# Patient Record
Sex: Male | Born: 1985 | Race: Black or African American | Hispanic: No | Marital: Single | Smoking: Former smoker
Health system: Southern US, Community
[De-identification: ages and names within clinical notes are randomized; demographics above are authoritative.]

## PROBLEM LIST (undated history)

## (undated) DIAGNOSIS — I10 Essential (primary) hypertension: Secondary | ICD-10-CM

## (undated) DIAGNOSIS — N289 Disorder of kidney and ureter, unspecified: Secondary | ICD-10-CM

---

## 2019-01-03 ENCOUNTER — Other Ambulatory Visit: Payer: Self-pay

## 2019-01-03 ENCOUNTER — Encounter (HOSPITAL_COMMUNITY): Payer: Self-pay

## 2019-01-03 ENCOUNTER — Emergency Department (HOSPITAL_COMMUNITY)
Admission: EM | Admit: 2019-01-03 | Discharge: 2019-01-04 | Disposition: A | Discharge status: Home / Self Care | Payer: No Typology Code available for payment source | Attending: Emergency Medicine | Admitting: Emergency Medicine

## 2019-01-03 ENCOUNTER — Emergency Department (HOSPITAL_COMMUNITY): Payer: No Typology Code available for payment source

## 2019-01-03 DIAGNOSIS — R0789 Other chest pain: Secondary | ICD-10-CM

## 2019-01-03 DIAGNOSIS — I10 Essential (primary) hypertension: Secondary | ICD-10-CM | POA: Insufficient documentation

## 2019-01-03 DIAGNOSIS — R109 Unspecified abdominal pain: Secondary | ICD-10-CM

## 2019-01-03 HISTORY — DX: Disorder of kidney and ureter, unspecified: N28.9

## 2019-01-03 HISTORY — DX: Essential (primary) hypertension: I10

## 2019-01-03 LAB — COMPREHENSIVE METABOLIC PANEL
ALT: 13 U/L (ref 0–44)
AST: 11 U/L — ABNORMAL LOW (ref 15–41)
Albumin: 4.1 g/dL (ref 3.5–5.0)
Alkaline Phosphatase: 65 U/L (ref 38–126)
Anion gap: 12 (ref 5–15)
BUN: 55 mg/dL — ABNORMAL HIGH (ref 6–20)
CO2: 25 mmol/L (ref 22–32)
Calcium: 9.3 mg/dL (ref 8.9–10.3)
Chloride: 101 mmol/L (ref 98–111)
Creatinine, Ser: 7.96 mg/dL — ABNORMAL HIGH (ref 0.61–1.24)
GFR calc Af Amer: 9 mL/min — ABNORMAL LOW (ref >60)
GFR calc non Af Amer: 8 mL/min — ABNORMAL LOW (ref >60)
Glucose, Bld: 106 mg/dL — ABNORMAL HIGH (ref 70–99)
Potassium: 3.9 mmol/L (ref 3.5–5.1)
Sodium: 138 mmol/L (ref 135–145)
Total Bilirubin: 0.8 mg/dL (ref 0.3–1.2)
Total Protein: 7.8 g/dL (ref 6.5–8.1)

## 2019-01-03 LAB — LIPASE, BLOOD: Lipase: 54 U/L — ABNORMAL HIGH (ref 11–51)

## 2019-01-03 LAB — CBC WITH DIFFERENTIAL/PLATELET
Abs Immature Granulocytes: 0.02 10*3/uL (ref 0.00–0.07)
Basophils Absolute: 0.1 10*3/uL (ref 0.0–0.1)
Basophils Relative: 1 %
Eosinophils Absolute: 0.5 10*3/uL (ref 0.0–0.5)
Eosinophils Relative: 6 %
HCT: 38.7 % — ABNORMAL LOW (ref 39.0–52.0)
Hemoglobin: 12.5 g/dL — ABNORMAL LOW (ref 13.0–17.0)
Immature Granulocytes: 0 %
Lymphocytes Relative: 21 %
Lymphs Abs: 1.9 10*3/uL (ref 0.7–4.0)
MCH: 30.9 pg (ref 26.0–34.0)
MCHC: 32.3 g/dL (ref 30.0–36.0)
MCV: 95.8 fL (ref 80.0–100.0)
Monocytes Absolute: 0.7 10*3/uL (ref 0.1–1.0)
Monocytes Relative: 8 %
Neutro Abs: 5.8 10*3/uL (ref 1.7–7.7)
Neutrophils Relative %: 64 %
Platelets: 278 10*3/uL (ref 150–400)
RBC: 4.04 MIL/uL — ABNORMAL LOW (ref 4.22–5.81)
RDW: 12 % (ref 11.5–15.5)
WBC: 9 10*3/uL (ref 4.0–10.5)
nRBC: 0 % (ref 0.0–0.2)

## 2019-01-03 MED ORDER — ACETAMINOPHEN 325 MG PO TABS
650.0000 mg | ORAL_TABLET | Freq: Once | ORAL | Status: AC
  Administered 2019-01-03: 650 mg via ORAL
  Filled 2019-01-03: qty 2

## 2019-01-03 MED ORDER — OXYCODONE-ACETAMINOPHEN 5-325 MG PO TABS
2.0000 | ORAL_TABLET | Freq: Once | ORAL | Status: DC
  Filled 2019-01-03: qty 2

## 2019-01-03 NOTE — ED Notes (Signed)
Quick look done by Dr Tyrone Nine in triage
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
## 2019-01-04 MED ORDER — OXYCODONE-ACETAMINOPHEN 5-325 MG PO TABS: 1.0000 | ORAL_TABLET | ORAL | 0 refills | Status: AC | PRN

## 2019-01-04 MED ORDER — CYCLOBENZAPRINE HCL 10 MG PO TABS: 10.0000 mg | ORAL_TABLET | Freq: Two times a day (BID) | ORAL | 0 refills | Status: DC | PRN

## 2019-01-04 MED ORDER — MORPHINE SULFATE (PF) 4 MG/ML IV SOLN
6.0000 mg | Freq: Once | INTRAVENOUS | Status: AC
  Administered 2019-01-04: 6 mg via INTRAMUSCULAR
  Filled 2019-01-04: qty 2
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
## 2019-01-05 ENCOUNTER — Telehealth: Payer: Self-pay | Admitting: *Deleted

## 2019-01-05 NOTE — Telephone Encounter (Signed)
TOC CM received call from pt and states Oxycodone was $60 at his pharmacy. He wanted transferred to [facility]. Provided pt with goodrx coupon for [facility] for $8.81. States he will pick up at his pharmacy. Flexeril with goodrx will cost $10.38. [place], [place] ED TOC CM [phone]

## 2021-02-16 ENCOUNTER — Emergency Department (HOSPITAL_COMMUNITY): Payer: No Typology Code available for payment source

## 2021-02-16 ENCOUNTER — Observation Stay (HOSPITAL_COMMUNITY)
Admission: EM | Admit: 2021-02-16 | Discharge: 2021-02-17 | Disposition: A | Discharge status: Home / Self Care | Payer: No Typology Code available for payment source | Attending: Internal Medicine | Admitting: Internal Medicine

## 2021-02-16 ENCOUNTER — Encounter (HOSPITAL_COMMUNITY): Payer: Self-pay

## 2021-02-16 ENCOUNTER — Other Ambulatory Visit: Payer: Self-pay

## 2021-02-16 DIAGNOSIS — Z9889 Other specified postprocedural states: Secondary | ICD-10-CM | POA: Insufficient documentation

## 2021-02-16 DIAGNOSIS — K659 Peritonitis, unspecified: Secondary | ICD-10-CM

## 2021-02-16 DIAGNOSIS — Z20822 Contact with and (suspected) exposure to covid-19: Secondary | ICD-10-CM | POA: Diagnosis not present

## 2021-02-16 DIAGNOSIS — Z992 Dependence on renal dialysis: Secondary | ICD-10-CM | POA: Diagnosis not present

## 2021-02-16 DIAGNOSIS — N186 End stage renal disease: Principal | ICD-10-CM | POA: Insufficient documentation

## 2021-02-16 DIAGNOSIS — R0602 Shortness of breath: Secondary | ICD-10-CM | POA: Diagnosis present

## 2021-02-16 DIAGNOSIS — Z87891 Personal history of nicotine dependence: Secondary | ICD-10-CM | POA: Diagnosis not present

## 2021-02-16 DIAGNOSIS — I120 Hypertensive chronic kidney disease with stage 5 chronic kidney disease or end stage renal disease: Secondary | ICD-10-CM | POA: Diagnosis not present

## 2021-02-16 DIAGNOSIS — E8770 Fluid overload, unspecified: Secondary | ICD-10-CM

## 2021-02-16 DIAGNOSIS — Z79899 Other long term (current) drug therapy: Secondary | ICD-10-CM | POA: Insufficient documentation

## 2021-02-16 LAB — BRAIN NATRIURETIC PEPTIDE: B Natriuretic Peptide: 2005.7 pg/mL — ABNORMAL HIGH (ref 0.0–100.0)

## 2021-02-16 LAB — COMPREHENSIVE METABOLIC PANEL
ALT: 24 U/L (ref 0–44)
AST: 21 U/L (ref 15–41)
Albumin: 3.4 g/dL — ABNORMAL LOW (ref 3.5–5.0)
Alkaline Phosphatase: 84 U/L (ref 38–126)
Anion gap: 17 — ABNORMAL HIGH (ref 5–15)
BUN: 84 mg/dL — ABNORMAL HIGH (ref 6–20)
CO2: 19 mmol/L — ABNORMAL LOW (ref 22–32)
Calcium: 8.5 mg/dL — ABNORMAL LOW (ref 8.9–10.3)
Chloride: 100 mmol/L (ref 98–111)
Creatinine, Ser: 19.32 mg/dL — ABNORMAL HIGH (ref 0.61–1.24)
GFR, Estimated: 3 mL/min — ABNORMAL LOW (ref >60)
Glucose, Bld: 111 mg/dL — ABNORMAL HIGH (ref 70–99)
Potassium: 5.6 mmol/L — ABNORMAL HIGH (ref 3.5–5.1)
Sodium: 136 mmol/L (ref 135–145)
Total Bilirubin: 0.8 mg/dL (ref 0.3–1.2)
Total Protein: 7 g/dL (ref 6.5–8.1)

## 2021-02-16 LAB — TROPONIN I (HIGH SENSITIVITY)
Troponin I (High Sensitivity): 58 ng/L — ABNORMAL HIGH (ref <18)
Troponin I (High Sensitivity): 53 ng/L — ABNORMAL HIGH (ref <18)

## 2021-02-16 LAB — CBC WITH DIFFERENTIAL/PLATELET
Abs Immature Granulocytes: 0.06 10*3/uL (ref 0.00–0.07)
Basophils Absolute: 0.1 10*3/uL (ref 0.0–0.1)
Basophils Relative: 1 %
Eosinophils Absolute: 0.4 10*3/uL (ref 0.0–0.5)
Eosinophils Relative: 4 %
HCT: 25.5 % — ABNORMAL LOW (ref 39.0–52.0)
Hemoglobin: 8.1 g/dL — ABNORMAL LOW (ref 13.0–17.0)
Immature Granulocytes: 1 %
Lymphocytes Relative: 14 %
Lymphs Abs: 1.5 10*3/uL (ref 0.7–4.0)
MCH: 31 pg (ref 26.0–34.0)
MCHC: 31.8 g/dL (ref 30.0–36.0)
MCV: 97.7 fL (ref 80.0–100.0)
Monocytes Absolute: 0.8 10*3/uL (ref 0.1–1.0)
Monocytes Relative: 7 %
Neutro Abs: 7.9 10*3/uL — ABNORMAL HIGH (ref 1.7–7.7)
Neutrophils Relative %: 73 %
Platelets: 318 10*3/uL (ref 150–400)
RBC: 2.61 MIL/uL — ABNORMAL LOW (ref 4.22–5.81)
RDW: 13.2 % (ref 11.5–15.5)
WBC: 10.8 10*3/uL — ABNORMAL HIGH (ref 4.0–10.5)
nRBC: 0 % (ref 0.0–0.2)

## 2021-02-16 LAB — HEPATITIS B SURFACE ANTIGEN: Hepatitis B Surface Ag: NONREACTIVE

## 2021-02-16 LAB — LIPASE, BLOOD: Lipase: 41 U/L (ref 11–51)

## 2021-02-16 LAB — RESP PANEL BY RT-PCR (FLU A&B, COVID) ARPGX2
Influenza A by PCR: NEGATIVE
Influenza B by PCR: NEGATIVE
SARS Coronavirus 2 by RT PCR: NEGATIVE

## 2021-02-16 LAB — HEPATITIS B SURFACE ANTIBODY,QUALITATIVE: Hep B S Ab: REACTIVE — AB

## 2021-02-16 LAB — VANCOMYCIN, RANDOM: Vancomycin Rm: 13

## 2021-02-16 LAB — HIV ANTIBODY (ROUTINE TESTING W REFLEX): HIV Screen 4th Generation wRfx: NONREACTIVE

## 2021-02-16 MED ORDER — HEPARIN SODIUM (PORCINE) 5000 UNIT/ML IJ SOLN
5000.0000 [IU] | Freq: Three times a day (TID) | INTRAMUSCULAR | Status: DC
  Administered 2021-02-16 – 2021-02-17 (×2): 5000 [IU] via SUBCUTANEOUS
  Filled 2021-02-16 (×2): qty 1

## 2021-02-16 MED ORDER — CHLORHEXIDINE GLUCONATE CLOTH 2 % EX PADS
6.0000 | MEDICATED_PAD | Freq: Every day | CUTANEOUS | Status: DC
  Administered 2021-02-17: 08:00:00 6 via TOPICAL

## 2021-02-16 MED ORDER — ACETAMINOPHEN 325 MG PO TABS
650.0000 mg | ORAL_TABLET | Freq: Four times a day (QID) | ORAL | Status: DC | PRN
  Administered 2021-02-16 – 2021-02-17 (×2): 650 mg via ORAL
  Filled 2021-02-16 (×2): qty 2

## 2021-02-16 MED ORDER — HEPARIN SODIUM (PORCINE) 1000 UNIT/ML DIALYSIS
2000.0000 [IU] | Freq: Once | INTRAMUSCULAR | Status: AC
  Administered 2021-02-16: 16:00:00 2000 [IU] via INTRAVENOUS_CENTRAL
  Filled 2021-02-16: qty 2

## 2021-02-16 MED ORDER — HYDROMORPHONE HCL 1 MG/ML IJ SOLN
1.0000 mg | Freq: Once | INTRAMUSCULAR | Status: AC
  Administered 2021-02-16: 12:00:00 1 mg via INTRAVENOUS
  Filled 2021-02-16: qty 1

## 2021-02-16 MED ORDER — ONDANSETRON HCL 4 MG PO TABS: 4.0000 mg | ORAL_TABLET | Freq: Four times a day (QID) | ORAL | Status: DC | PRN

## 2021-02-16 MED ORDER — VANCOMYCIN HCL 1750 MG/350ML IV SOLN
1750.0000 mg | Freq: Once | INTRAVENOUS | Status: DC
  Filled 2021-02-16: qty 350

## 2021-02-16 MED ORDER — ONDANSETRON HCL 4 MG/2ML IJ SOLN
4.0000 mg | Freq: Four times a day (QID) | INTRAMUSCULAR | Status: DC | PRN
  Administered 2021-02-16 (×2): 4 mg via INTRAVENOUS
  Filled 2021-02-16 (×2): qty 2

## 2021-02-16 MED ORDER — OXYCODONE-ACETAMINOPHEN 5-325 MG PO TABS: 1.0000 | ORAL_TABLET | ORAL | Status: DC | PRN

## 2021-02-16 MED ORDER — VANCOMYCIN HCL 750 MG/150ML IV SOLN
750.0000 mg | INTRAVENOUS | Status: DC
Start: 2021-02-18 — End: 2021-02-17

## 2021-02-16 MED ORDER — HYDRALAZINE HCL 50 MG PO TABS
50.0000 mg | ORAL_TABLET | Freq: Three times a day (TID) | ORAL | Status: DC
  Administered 2021-02-16 – 2021-02-17 (×3): 50 mg via ORAL
  Filled 2021-02-16: qty 1
  Filled 2021-02-16: qty 2
  Filled 2021-02-16: qty 1

## 2021-02-16 MED ORDER — DARBEPOETIN ALFA 60 MCG/0.3ML IJ SOSY
60.0000 ug | PREFILLED_SYRINGE | INTRAMUSCULAR | Status: DC
  Administered 2021-02-16: 16:00:00 60 ug via INTRAVENOUS
  Filled 2021-02-16: qty 0.3

## 2021-02-16 MED ORDER — FAMOTIDINE 20 MG PO TABS
40.0000 mg | ORAL_TABLET | Freq: Every day | ORAL | Status: DC
  Administered 2021-02-17: 08:00:00 40 mg via ORAL
  Filled 2021-02-16: qty 2

## 2021-02-16 MED ORDER — FENTANYL CITRATE PF 50 MCG/ML IJ SOSY
50.0000 ug | PREFILLED_SYRINGE | Freq: Once | INTRAMUSCULAR | Status: AC
  Administered 2021-02-16: 11:00:00 50 ug via INTRAVENOUS
  Filled 2021-02-16: qty 1

## 2021-02-16 MED ORDER — VANCOMYCIN HCL IN DEXTROSE 1-5 GM/200ML-% IV SOLN
1000.0000 mg | INTRAVENOUS | Status: AC
  Administered 2021-02-16: 1000 mg via INTRAVENOUS
  Filled 2021-02-16: qty 200

## 2021-02-16 MED ORDER — SEVELAMER CARBONATE 800 MG PO TABS
800.0000 mg | ORAL_TABLET | Freq: Three times a day (TID) | ORAL | Status: DC
  Administered 2021-02-17: 08:00:00 800 mg via ORAL
  Filled 2021-02-16: qty 1

## 2021-02-16 MED ORDER — POLYETHYLENE GLYCOL 3350 17 G PO PACK: 17.0000 g | PACK | Freq: Every day | ORAL | Status: DC | PRN

## 2021-02-16 MED ORDER — HYDROMORPHONE HCL 1 MG/ML IJ SOLN
0.5000 mg | INTRAMUSCULAR | Status: DC | PRN
Start: 2021-02-16 — End: 2021-02-17
  Administered 2021-02-16: 20:00:00 1 mg via INTRAVENOUS
  Filled 2021-02-16: qty 1

## 2021-02-16 MED ORDER — ACETAMINOPHEN 650 MG RE SUPP: 650.0000 mg | Freq: Four times a day (QID) | RECTAL | Status: DC | PRN

## 2021-02-16 NOTE — Progress Notes (Addendum)
Pharmacy Antibiotic Note  Daniel Stephenson is a 35 y.o. male admitted on 02/16/2021 with  PD cath related peritonitis .  Pharmacy has been consulted for vancomycin dosing.  Presenting with SOB and no HD for 6 days (was on PD until hernia repair 1 month ago and was changed to HD). Visiting from [place]. Has known infection from pubic catheter - had been getting antibiotics while in dialysis but was unable to get dialysis set up here in [place] while visiting. Plan for HD while here. WBC 10.8, Scr 19.32. Afebrile.   Vancomycin random prior to HD start came back at 13 - slightly below goal range (15-25).   Plan: Vancomycin 1000 mg IV once today then 750 mg IV qHD starting Wednesday Monitor cx results, clinical pic, and vanc levels as appropriate  Height: 5\' 4"  (162.6 cm) Weight: 78.9 kg (174 lb) IBW/kg (Calculated) : 59.2  Temp (24hrs), Avg:98.2 F (36.8 C), Min:98.2 F (36.8 C), Max:98.2 F (36.8 C)  Recent Labs  Lab 02/16/21 0820  WBC 10.8*  CREATININE 19.32*    Estimated Creatinine Clearance: 5.1 mL/min (A) (by C-G formula based on SCr of 19.32 mg/dL (H)).    Not on File  Antimicrobials this admission: Vancomycin 12/26 >>   Dose adjustments this admission: 12/26 VR at start of HD 13: gave 1g IV with HD   Microbiology results: 12/26 Peritoneal Fluid Cx: sent 12/26 COVID/Flu PCR: neg  Thank you for allowing pharmacy to be a part of this patients care.  Antonietta Jewel, PharmD, [place] Clinical Pharmacist  Phone: [phone] 02/16/2021 11:43 AM  Please check AMION for all [place] phone numbers After 10:00 PM, call [place] [phone]

## 2021-02-16 NOTE — ED Notes (Signed)
Pt has refused labs and would like for them to be pulled from his IV.

## 2021-02-16 NOTE — H&P (Addendum)
Date: 02/16/2021               Patient Name:  Daniel Stephenson MRN: [id number]  DOB: 1985-09-12 Age / Sex: 35 y.o., male   PCP: Patient, No Pcp Per (Inactive)         Medical Service: Internal Medicine Teaching Service         Attending Physician: Dr. Jimmye Norman, Elaina Pattee, MD    First Contact: Dr. Alvie Heidelberg Pager: [phone]  Second Contact: Dr. Allyson Sabal Pager: [phone]       After Hours (After 5p/  First Contact Pager: [phone]  weekends / holidays): Second Contact Pager: [phone]   Chief Complaint: shortness of breath  History of Present Illness:  Daniel Stephenson is a 35 year old male with a PMHx of HTN and ESRD on MWF HD presenting with missed dialysis and shortness of breath.   The patient reportedly developed ESRD when he was 35 years old. He reports this was due to hypertension. He has done peritoneal dialysis for years, but 1 month ago he developed an umbilical hernia and was switched to HD to have the repair performed. The repair was performed successfully. Unfortunately several days later he developed severe abdominal pain. His PD catheter had leaked and become unscrewed. He reportedly went to [facility], was diagnosed with peritonitis, and received vancomycin with relief of his pain and improvement in his clinical status. He was discharged and was set to receive vanc at his next dialysis session, but left to visit [place] before this occurred.   The patient lives in [place], [place] and came to [place] with his wife to visit his parents. He was unable to get dialysis scheduled with [facility] but decided to come anyway.   He began having shortness of breath beginning last night. He reports that he has only missed dialysis one time and is therefore unfamiliar about symptoms when this occurs. He is at his dry weight of 174 lbs. He has also been having some abdominal pain, which he rates as a 6/10, nothing like the pain he had with his peritonitis.  ED Course: BP from 150-180, notable labs  include BNP of 2K, CXR with pulmonary edema and a possible RLL opacity, WBCs at 10.8.   Meds:  Labetalol 400mg  bid Torsemide 20mg  daily  Doxazosin 4mg  BID  Nifedipine 60mg  BID Sevelamer 800mg  TID  Hydralazine 50mg  TID  Famotidine 40mg  BID  Oxycodone-acetaminophen 5-325mg  prn, reportedly takes 1 per day   Allergies: Allergies as of 02/16/2021   (Not on File)   Past Medical History:  Diagnosis Date   Hypertension    Peritoneal dialysis status ([place])    Renal disorder     Family History:  No family history of kidney disease. Strong family history of HTN on both sides. No history of cancers or heart attacks.  Social History:  Lives with wife at home. Independent in ADLs and iADLs. Does not consume alcohol, does not smoke cigarettes, no illicit drug use.  PCP is Verdie Mosher, MD at [facility] in [place]. Gets HD at [facility] in [place]. Former Nephrologist is Dr. Glennon Mac, but currently switching nephrologists, does not have a new one yet.  Review of Systems: A complete ROS was negative except as per HPI.    Physical Exam: Blood pressure (!) 176/114, pulse 94, temperature 98 F (36.7 C), temperature source Oral, resp. rate (!) 25, height 5\' 4"  (1.626 m), weight 78.9 kg, SpO2 96 %. Physical Exam Vitals reviewed.  Constitutional:  Comments: Lethargic, intermittently alert for questioning  Cardiovascular:     Rate and Rhythm: Normal rate and regular rhythm.     Heart sounds: No murmur heard. Pulmonary:     Comments: Tachypnea with belly breathing,  Clear to auscultation Abdominal:     Palpations: Abdomen is soft.     Tenderness: There is abdominal tenderness.     Comments: Surgical scar above the umbilicus Moderately tender to palpation No guarding, does not appear peritonitic PD cath exit site is covered with guaze, no drainage noted.  Musculoskeletal:     Comments: Trace lower extremity pitting edema  Skin:    General: Skin is warm  and dry.     Comments: Tunneled line emerges near the R clavicle   EKG: personally reviewed my interpretation is normal sinus rhythm  CXR: personally reviewed my interpretation is potential interstitial edema, possible RLL opacity  Assessment & Plan by Problem: Daniel Stephenson is a 35 year old male with a PMHx of HTN and ESRD on MWF HD presenting with missed dialysis and shortness of breath.   #ESRD on MWF HD #HTN Vitals, labs, imaging, and presentation consistent with volume overload 2/2 missed dialysis. Nephro is planning for dialysis today. His home anti-hypertensive regimen is extensive, so we will hold his medications except for the below.  -nephrology consult, appreciate recs -start Hydralazine 50 mg TID -Monitor BP  #Hx PD cath related peritonitis #Acute pain Low suspicion for recurrence of peritonitis. Patient's abdominal pain is similar to the pain he has had from his umbilical hernia repair.  -Continue IV vanc -Records request for [facility] -Consider sampling ascitic fluid if condition worsens -Start Dilaudid 0.5-1 mg q3hrs PRN  -Restart home Percocet 5-325 q4hrs PRN starting tomorrow   Dispo: Admit patient to Inpatient with expected length of stay greater than 2 midnights.  Signed: Corky Sox, MD PGY-1 Pager: [phone] After 5pm on weekdays and 1pm on weekends: On Call pager: [phone]

## 2021-02-16 NOTE — Procedures (Signed)
° °  I was present at this dialysis session, have reviewed the session itself and made  appropriate changes Kelly Splinter MD [place] pager [phone]   02/16/2021, 5:36 PM
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
## 2021-02-16 NOTE — ED Provider Notes (Signed)
[facility] EMERGENCY DEPARTMENT Provider Note   CSN: [id number] Arrival date & time: 02/16/21  0725     History Chief Complaint  Patient presents with   Shortness of Breath   Abdominal Pain    Daniel Stephenson is a 35 y.o. male.  Presenting to the emergency room with concern for shortness of breath.  Reports that has had a little bit of shortness of breath the last few days but seems to be getting a lot worse since last night.  Worse with exertion, improved with rest, worse with lying flat.  Reports that he is visiting from out of town and last received dialysis 6 days ago.  Reports he normally does peritoneal dialysis however he had a hernia repair approximately 1 month ago and was told that he will need to use hemodialysis for couple months postoperatively while his abdomen heals.  Reports that a little over a week ago he was admitted to the hospital in [place] for peritonitis.  Reports that he was supposed to receive intraperitoneal antibiotics after discharge while in the dialysis clinic however he came to [place] to visit family and had contacted a [facility] dialysis clinic to set up dialysis but was unsuccessful in getting dialysis set up here in [place].  So in addition to missing dialysis he has also missed his intraperitoneal antibiotics for recent diagnosis of peritonitis.  He denies fever but has had some ongoing abdominal pain and discomfort.  No nausea or vomiting.  Pain is currently mild.  He is getting hemodialysis through right chest tunneled catheter, no issues recently.  HPI     Past Medical History:  Diagnosis Date   Hypertension    Peritoneal dialysis status [facility])    Renal disorder     Patient Active Problem List   Diagnosis Date Noted   Admission for acute hemodialysis ([place]) 02/16/2021    History reviewed. No pertinent surgical history.     History reviewed. No pertinent family history.  Social History   Tobacco Use    Smoking status: Former    Types: Cigarettes   Smokeless tobacco: Never  Substance Use Topics   Alcohol use: Not Currently   Drug use: Not Currently    Home Medications Prior to Admission medications   Medication Sig Start Date End Date Taking? Authorizing Provider  cyclobenzaprine (FLEXERIL) 5 MG tablet Take 5 mg by mouth 3 (three) times daily as needed for muscle spasms. 02/10/21  Yes [provider]  doxazosin (CARDURA) 2 MG tablet Take 2 mg by mouth 2 (two) times daily. 02/11/21  Yes [provider]  famotidine (PEPCID) 40 MG tablet Take 40 mg by mouth 2 (two) times daily. 02/14/21  Yes [provider]  hydrALAZINE (APRESOLINE) 50 MG tablet Take 50 mg by mouth 3 (three) times daily. 02/14/21  Yes [provider]  HYDROcodone-acetaminophen (NORCO/VICODIN) 5-325 MG tablet Take 1 tablet by mouth every 6 (six) hours as needed for pain. 02/10/21  Yes [provider]  labetalol (NORMODYNE) 200 MG tablet Take 400 mg by mouth 2 (two) times daily. 02/14/21  Yes [provider]  NIFEdipine (PROCARDIA XL/NIFEDICAL XL) 60 MG 24 hr tablet Take 60 mg by mouth 2 (two) times daily. 02/14/21  Yes [provider]  ondansetron (ZOFRAN-ODT) 8 MG disintegrating tablet Take 8 mg by mouth. 11/23/20  Yes [provider]  Oxycodone HCl 10 MG TABS Take 10 mg by mouth as needed (hernia repair pain). 02/02/21  Yes [provider]  oxyCODONE-acetaminophen (PERCOCET/ROXICET) 5-325 MG tablet Take 1 tablet by mouth every 4 (four) hours as needed for severe pain. 01/04/19  Yes Charlann Lange, PA-C  sevelamer carbonate (RENVELA) 800 MG tablet Take 800 mg by mouth 3 (three) times daily. 12/24/20  Yes [provider]  torsemide (DEMADEX) 20 MG tablet Take 40 mg by mouth 2 (two) times daily. 02/14/21  Yes [provider]  cyclobenzaprine (FLEXERIL) 10 MG tablet Take 1 tablet (10 mg total) by mouth 2 (two) times daily as needed for  muscle spasms. Patient not taking: Reported on 02/16/2021 01/04/19   Charlann Lange, PA-C  dicyclomine (BENTYL) 10 MG capsule Take 10 mg by mouth 3 (three) times daily. 12/17/20   [provider]    Allergies    Patient has no allergy information on record.  Review of Systems   Review of Systems  Constitutional:  Positive for fatigue. Negative for chills and fever.  HENT:  Negative for ear pain and sore throat.   Eyes:  Negative for pain and visual disturbance.  Respiratory:  Positive for shortness of breath. Negative for cough.   Cardiovascular:  Negative for chest pain and palpitations.  Gastrointestinal:  Positive for abdominal pain. Negative for vomiting.  Genitourinary:  Negative for dysuria and hematuria.  Musculoskeletal:  Negative for arthralgias and back pain.  Skin:  Negative for color change and rash.  Neurological:  Negative for seizures and syncope.  All other systems reviewed and are negative.  Physical Exam Updated Vital Signs BP (!) 173/110    Pulse 90    Temp 98.2 F (36.8 C) (Oral)    Resp (!) 9    Ht 5\' 4"  (1.626 m)    Wt 78.9 kg    SpO2 93%    BMI 29.87 kg/m   Physical Exam Vitals and nursing note reviewed.  Constitutional:      General: He is not in acute distress.    Appearance: He is well-developed.  HENT:     Head: Normocephalic and atraumatic.  Eyes:     Conjunctiva/sclera: Conjunctivae normal.  Cardiovascular:     Rate and Rhythm: Normal rate and regular rhythm.     Heart sounds: No murmur heard. Pulmonary:     Effort: Pulmonary effort is normal. No respiratory distress.     Breath sounds: Normal breath sounds.  Abdominal:     Palpations: Abdomen is soft.     Comments: Generalized tenderness, no rebound guarding or distention  Musculoskeletal:        General: No swelling.     Cervical back: Neck supple.     Right lower leg: No edema.     Left lower leg: No edema.  Skin:    General: Skin is warm and dry.     Capillary Refill:  Capillary refill takes less than 2 seconds.  Neurological:     Mental Status: He is alert.  Psychiatric:        Mood and Affect: Mood normal.    ED Results / Procedures / Treatments   Labs (all labs ordered are listed, but only abnormal results are displayed) Labs Reviewed  CBC WITH DIFFERENTIAL/PLATELET - Abnormal; Notable for the following components:      Result Value   WBC 10.8 (*)    RBC 2.61 (*)    Hemoglobin 8.1 (*)    HCT 25.5 (*)    Neutro Abs 7.9 (*)    All other components within normal limits  COMPREHENSIVE METABOLIC PANEL - Abnormal; Notable for  the following components:   Potassium 5.6 (*)    CO2 19 (*)    Glucose, Bld 111 (*)    BUN 84 (*)    Creatinine, Ser 19.32 (*)    Calcium 8.5 (*)    Albumin 3.4 (*)    GFR, Estimated 3 (*)    Anion gap 17 (*)    All other components within normal limits  BRAIN NATRIURETIC PEPTIDE - Abnormal; Notable for the following components:   B Natriuretic Peptide 2,005.7 (*)    All other components within normal limits  TROPONIN I (HIGH SENSITIVITY) - Abnormal; Notable for the following components:   Troponin I (High Sensitivity) 58 (*)    All other components within normal limits  RESP PANEL BY RT-PCR (FLU A&B, COVID) ARPGX2  BODY FLUID CULTURE W GRAM STAIN  LIPASE, BLOOD  BODY FLUID CELL COUNT WITH DIFFERENTIAL  HEPATITIS B SURFACE ANTIGEN  HEPATITIS B SURFACE ANTIBODY,QUALITATIVE  HEPATITIS B SURFACE ANTIBODY, QUANTITATIVE  HIV ANTIBODY (ROUTINE TESTING W REFLEX)  TROPONIN I (HIGH SENSITIVITY)    EKG EKG Interpretation  Date/Time:  Monday February 16 2021 07:38:54 EST Ventricular Rate:  91 PR Interval:  153 QRS Duration: 89 QT Interval:  390 QTC Calculation: 480 R Axis:   63 Text Interpretation: Sinus rhythm Nonspecific T abnrm, anterolateral leads Borderline prolonged QT interval Confirmed by Madalyn Rob [phone]) on 02/16/2021 8:12:43 AM  Radiology CT ABDOMEN PELVIS WO CONTRAST  Result Date:  02/16/2021 CLINICAL DATA:  Abdominal pain EXAM: CT ABDOMEN AND PELVIS WITHOUT CONTRAST TECHNIQUE: Multidetector CT imaging of the abdomen and pelvis was performed following the standard protocol without IV contrast. COMPARISON:  01/03/2019 FINDINGS: Lower chest: Fairly extensive new patchy alveolar densities seen in both lower lung fields. Heart is enlarged in size. Minimal bilateral pleural effusions are seen. Hepatobiliary: Liver measures 22.2 cm in length. There is no dilation of bile ducts. Gallbladder is unremarkable. Pancreas: No focal abnormality is seen. Spleen: Unremarkable. Adrenals/Urinary Tract: There is possible hyperplasia of left adrenal. Native kidneys are small in size. There is no hydronephrosis. There are no renal or ureteral stones. Urinary bladder is unremarkable. Stomach/Bowel: Stomach is unremarkable. Small bowel loops are not dilated. Appendix is not seen. There is no focal pericecal inflammation. There is no significant wall thickening in the colon. Vascular/Lymphatic: Unremarkable. Reproductive: Unremarkable. Other: Small ascites is present. Small amount of pneumoperitoneum is noted. Peritoneal dialysis catheter is seen in the pelvis. There is stranding in the subcutaneous plane suggesting anasarca. There is edema in the subcutaneous plane in the periumbilical region without any loculated fluid collections. Musculoskeletal: Unremarkable. IMPRESSION: There are new patchy infiltrates in both lower lung fields suggesting bilateral pneumonia. Minimal bilateral pleural effusions are seen more so on the right side. Cardiomegaly. There is no evidence of intestinal obstruction. There is no hydronephrosis. Native kidneys are small in size consistent with medical renal disease. Small ascites. Small amount of pneumoperitoneum is seen. This may be related to peritoneal dialysis or suggest bowel perforation. Please correlate with clinical symptoms and physical examination findings. Enlarged liver.  Electronically Signed   By: Elmer Picker M.D.   On: 02/16/2021 11:35   DG Chest Portable 1 View  Result Date: 02/16/2021 CLINICAL DATA:  Shortness of breath for 6 days without dialysis. EXAM: PORTABLE CHEST 1 VIEW COMPARISON:  01/03/2019 FINDINGS: Dialysis catheter tip at mid right atrium. Numerous leads and wires project over the chest. Midline trachea. Mild cardiomegaly. Mild right hemidiaphragm elevation. No pleural effusion or pneumothorax. Right greater than  left interstitial and airspace disease is slightly lower lobe predominant. The most confluent opacity is in the right lower lobe. IMPRESSION: Bilateral interstitial and airspace disease, which given the clinical history, is most consistent with pulmonary edema. Given the more confluent right lower lobe opacity, infection cannot be entirely excluded but is felt less likely. Electronically Signed   By: Abigail Miyamoto M.D.   On: 02/16/2021 08:14    Procedures Procedures   Medications Ordered in ED Medications  Chlorhexidine Gluconate Cloth 2 % PADS 6 each (has no administration in time range)  heparin injection 2,000 Units (has no administration in time range)  HYDROmorphone (DILAUDID) injection 1-2 mg (has no administration in time range)  HYDROmorphone (DILAUDID) injection 0.5-1 mg (has no administration in time range)  vancomycin (VANCOREADY) IVPB 1750 mg/350 mL (has no administration in time range)  acetaminophen (TYLENOL) tablet 650 mg (has no administration in time range)    Or  acetaminophen (TYLENOL) suppository 650 mg (has no administration in time range)  polyethylene glycol (MIRALAX / GLYCOLAX) packet 17 g (has no administration in time range)  ondansetron (ZOFRAN) tablet 4 mg (has no administration in time range)    Or  ondansetron (ZOFRAN) injection 4 mg (has no administration in time range)  heparin injection 5,000 Units (has no administration in time range)  fentaNYL (SUBLIMAZE) injection 50 mcg (50 mcg Intravenous  Given 02/16/21 1041)    ED Course  I have reviewed the triage vital signs and the nursing notes.  Pertinent labs & imaging results that were available during my care of the patient were reviewed by me and considered in my medical decision making (see chart for details).    MDM Rules/Calculators/A&P                         35 year old gentleman with dialysis presenting to ER with concern for shortness of breath and abdominal pain.  CXR with bilateral interstitial and airspace disease most consistent with pulmonary edema.  Lab work notable for significant elevation in creatinine.  Suspect findings related to multiple missed dialysis sessions.  Patient reports he was diagnosed with peritonitis and was supposed to be receiving IV vancomycin after discharge from recent hospitalization however due to missed dialysis he has not had any further doses of vancomycin.  Had originally asked a dialysis nurse to check new peritoneal fluid sample however was told there may be an issue with his peritoneal dialysis catheter and they were not successful in getting a sample.  CT abdomen pelvis obtained, no acute intra-abdominal pathology except radiologist commented on small amount of pneumoperitoneum which could be from peritoneal dialysis or bowel perforation.  Given patient's soft abdomen and well appearance, I have very low clinical suspicion for bowel perforation and suspect this finding is related to his peritoneal dialysis.  Discussed patient's case with Dr. Jonnie Finner on-call for nephrology.  He recommends admission to medicine service and initiation of IV antibiotics.  He will arrange for dialysis.  Discussed with internal medicine teaching service and they will come evaluate and admit patient.    Final Clinical Impression(s) / ED Diagnoses Final diagnoses:  Shortness of breath  ESRD (end stage renal disease) (HCC)  Hypervolemia, unspecified hypervolemia type  Peritonitis [facility])    Rx / DC Orders ED  Discharge Orders     None        Lucrezia Starch, MD 02/16/21 1203

## 2021-02-16 NOTE — ED Notes (Signed)
No signs of distress. No complaints. Alert, drowsy, oriented x 4. Transported to dialysis via stretcher

## 2021-02-16 NOTE — Consult Note (Signed)
Renal Service Consult Note [facility]  Daniel Stephenson 02/16/2021 Sol Blazing, MD Requesting Physician: Dr Dorian Pod  Reason for Consult: ESRD pt w/ abd pain HPI: The patient is a 35 y.o. year-old w/ hx of HTN, ESRD started PD in march 2022 in [place]. In November switched to TDC/ HD after hernia repair surgery. Recently pt "rolled over" on his PD cath at night and is "broke". Pt was admitted w/ PD cath related peritonitis per the patient, they gave him vancomycin and his cultures grew "Staph". His abdominal pain responded "immediately" to antibiotics and he was dc'd about 1 wk ago and was supposed to get IV vanc with outpt dialysis. However, pt came to [facility] and doesn't have HD set up here so he has missed his OP abx, as well as regular dialysis. Presenting to ED now w/ severe abd pain and SOB /orthopnea.  In ED CXR shows pulm edema. K+ 5.6, creat `19, BNP 20005. Hb 8.1.  WBC 10.  Asked to see for ESRD.   Seen in ED. Pt states the PD nurse came this morning not too long ago to put fluid in his belly for cultures/ cell counts, but he couldn't tolerate any fluid in his abdomen so the procedure was aborted. His main c/o is diffuse abd pain. No PD cath exit site drainage or pain. +SOB at rest, not severe, no fevers, chills or sweats.  Last HD 6 days ago.     ROS - denies CP, no joint pain, no HA, no blurry vision, no rash, no diarrhea, no nausea/ vomiting   Past Medical History  Past Medical History:  Diagnosis Date   Hypertension    Peritoneal dialysis status ([place])    Renal disorder    Past Surgical History History reviewed. No pertinent surgical history. Family History History reviewed. No pertinent family history. Social History  reports that he has quit smoking. His smoking use included cigarettes. He has never used smokeless tobacco. He reports that he does not currently use alcohol. He reports that he does not currently use drugs. Allergies Not on  File Home medications Prior to Admission medications   Medication Sig Start Date End Date Taking? Authorizing Provider  cyclobenzaprine (FLEXERIL) 5 MG tablet Take 5 mg by mouth 3 (three) times daily as needed for muscle spasms. 02/10/21  Yes [provider]  doxazosin (CARDURA) 2 MG tablet Take 2 mg by mouth 2 (two) times daily. 02/11/21  Yes [provider]  famotidine (PEPCID) 40 MG tablet Take 40 mg by mouth 2 (two) times daily. 02/14/21  Yes [provider]  hydrALAZINE (APRESOLINE) 50 MG tablet Take 50 mg by mouth 3 (three) times daily. 02/14/21  Yes [provider]  HYDROcodone-acetaminophen (NORCO/VICODIN) 5-325 MG tablet Take 1 tablet by mouth every 6 (six) hours as needed for pain. 02/10/21  Yes [provider]  labetalol (NORMODYNE) 200 MG tablet Take 400 mg by mouth 2 (two) times daily. 02/14/21  Yes [provider]  NIFEdipine (PROCARDIA XL/NIFEDICAL XL) 60 MG 24 hr tablet Take 60 mg by mouth 2 (two) times daily. 02/14/21  Yes [provider]  ondansetron (ZOFRAN-ODT) 8 MG disintegrating tablet Take 8 mg by mouth. 11/23/20  Yes [provider]  Oxycodone HCl 10 MG TABS Take 10 mg by mouth as needed (hernia repair pain). 02/02/21  Yes [provider]  oxyCODONE-acetaminophen (PERCOCET/ROXICET) 5-325 MG tablet Take 1 tablet by mouth every 4 (four) hours as needed for severe pain. 01/04/19  Yes Charlann Lange, PA-C  sevelamer carbonate (RENVELA) 800 MG tablet Take 800 mg by mouth 3 (three) times daily. 12/24/20  Yes [provider]  torsemide (DEMADEX) 20 MG tablet Take 40 mg by mouth 2 (two) times daily. 02/14/21  Yes [provider]  cyclobenzaprine (FLEXERIL) 10 MG tablet Take 1 tablet (10 mg total) by mouth 2 (two) times daily as needed for muscle spasms. Patient not taking: Reported on 02/16/2021 01/04/19   Charlann Lange, PA-C  dicyclomine (BENTYL) 10 MG capsule Take 10 mg by mouth 3  (three) times daily. 12/17/20   [provider]     Vitals:   02/16/21 0845 02/16/21 0855 02/16/21 0859 02/16/21 1045  BP: (!) 160/112  (!) 156/105 (!) 173/110  Pulse: 91  91 90  Resp: 18  18 (!) 9  Temp:      TempSrc:      SpO2: 91% 94% 99% 93%  Weight:      Height:       Exam Gen alert, no distress, nasal O2 No rash, cyanosis or gangrene Sclera anicteric, throat clear  No jvd or bruits Chest dec'd at bases, some R basilar rales, no wheezing RRR no MRG Abd soft ntnd no mass or ascites +bs GU normal MS no joint effusions or deformity Ext 1-2+ bilat pretib edema, no wounds or ulcers Neuro is alert, Ox 3 , nf    Home meds include - flexeril, cardura 2 bid, pepcid, hydralazine 50 tid, labetalol 400 bid, norco prn, procardia xl 60 bid, Ocy IR prn, percocet prn, renvela 800 ac tid, demadex 40 bid, bentyl 10 tid,  prns / vits/ supps    OP HD: MWF  [facility] [address] [place]    Phone [phone]    3.5h  2/2.5 bath  350/500  74kg   Hep 2000     -mircera 50 ug last dose 12/12   Assessment/ Plan: Abdominal pain/ recent PD cath related peritonitis - suspect recurrent infection from not getting IV abx for the last week.  Didn't tolerate attempt to place fluid in abdomen, will just treat as if this is same infection which is most likely. Vanc IV per pharmacy.  SOB/ pulm edema - d/t vol overload, needs HD.  ESRD - on HD. HD today, will prioritize him.  HTN - main other issue, on multiple BP meds Anemia ckd - Hb 8-9 range, follow. Will cont darbe 50 ug weekly while here.  MBD ckd - cont binder      Kelly Splinter  MD 02/16/2021, 12:11 PM  Recent Labs  Lab 02/16/21 0820  WBC 10.8*  HGB 8.1*   Recent Labs  Lab 02/16/21 0820  K 5.6*  BUN 84*  CREATININE 19.32*  CALCIUM 8.5*

## 2021-02-17 ENCOUNTER — Encounter (HOSPITAL_COMMUNITY): Payer: Self-pay | Admitting: Internal Medicine

## 2021-02-17 DIAGNOSIS — Z992 Dependence on renal dialysis: Secondary | ICD-10-CM | POA: Diagnosis not present

## 2021-02-17 LAB — CBC
HCT: 22.8 % — ABNORMAL LOW (ref 39.0–52.0)
Hemoglobin: 7.6 g/dL — ABNORMAL LOW (ref 13.0–17.0)
MCH: 31.8 pg (ref 26.0–34.0)
MCHC: 33.3 g/dL (ref 30.0–36.0)
MCV: 95.4 fL (ref 80.0–100.0)
Platelets: 280 10*3/uL (ref 150–400)
RBC: 2.39 MIL/uL — ABNORMAL LOW (ref 4.22–5.81)
RDW: 13.2 % (ref 11.5–15.5)
WBC: 11 10*3/uL — ABNORMAL HIGH (ref 4.0–10.5)
nRBC: 0 % (ref 0.0–0.2)

## 2021-02-17 LAB — RENAL FUNCTION PANEL
Albumin: 2.8 g/dL — ABNORMAL LOW (ref 3.5–5.0)
Anion gap: 13 (ref 5–15)
BUN: 39 mg/dL — ABNORMAL HIGH (ref 6–20)
CO2: 26 mmol/L (ref 22–32)
Calcium: 8.7 mg/dL — ABNORMAL LOW (ref 8.9–10.3)
Chloride: 98 mmol/L (ref 98–111)
Creatinine, Ser: 11.21 mg/dL — ABNORMAL HIGH (ref 0.61–1.24)
GFR, Estimated: 6 mL/min — ABNORMAL LOW (ref >60)
Glucose, Bld: 103 mg/dL — ABNORMAL HIGH (ref 70–99)
Phosphorus: 6.7 mg/dL — ABNORMAL HIGH (ref 2.5–4.6)
Potassium: 4.7 mmol/L (ref 3.5–5.1)
Sodium: 137 mmol/L (ref 135–145)

## 2021-02-17 LAB — PROTIME-INR
INR: 1.3 — ABNORMAL HIGH (ref 0.8–1.2)
Prothrombin Time: 16.1 seconds — ABNORMAL HIGH (ref 11.4–15.2)

## 2021-02-17 MED ORDER — NIFEDIPINE ER OSMOTIC RELEASE 60 MG PO TB24
60.0000 mg | ORAL_TABLET | Freq: Two times a day (BID) | ORAL | Status: DC
  Administered 2021-02-17: 08:00:00 60 mg via ORAL
  Filled 2021-02-17 (×2): qty 1

## 2021-02-17 MED ORDER — LABETALOL HCL 200 MG PO TABS
400.0000 mg | ORAL_TABLET | Freq: Two times a day (BID) | ORAL | Status: DC
  Administered 2021-02-17 (×2): 400 mg via ORAL
  Filled 2021-02-17 (×2): qty 2

## 2021-02-17 MED ORDER — TORSEMIDE 20 MG PO TABS
40.0000 mg | ORAL_TABLET | Freq: Two times a day (BID) | ORAL | Status: DC
  Administered 2021-02-17: 08:00:00 40 mg via ORAL
  Filled 2021-02-17: qty 2

## 2021-02-17 MED ORDER — ORAL CARE MOUTH RINSE
15.0000 mL | Freq: Two times a day (BID) | OROMUCOSAL | Status: DC
  Administered 2021-02-17: 12:00:00 15 mL via OROMUCOSAL

## 2021-02-17 MED ORDER — DOXAZOSIN MESYLATE 2 MG PO TABS
2.0000 mg | ORAL_TABLET | Freq: Two times a day (BID) | ORAL | Status: DC
  Administered 2021-02-17: 12:00:00 2 mg via ORAL
  Filled 2021-02-17 (×2): qty 1

## 2021-02-17 NOTE — Discharge Instructions (Signed)
You were admitted for shortness of breath. We think this was likely due to you missing dialysis. Please attend dialysis tomorrow and ask about resuming your IV vancomycin. You can continue your home medication as prescribed. If you have any questions or concerns, call our clinic at [phone] or after hours call [phone] and ask for the internal medicine resident on call.   Corky Sox, MD

## 2021-02-17 NOTE — TOC Progression Note (Signed)
Transition of Care [facility]) - Progression Note    Patient Details  Name: Daniel Stephenson MRN: [id number] Date of Birth: 1985-03-08  Transition of Care [facility]) CM/SW Contact  Zenon Mayo, RN Phone Number: 02/17/2021, 9:14 AM  Clinical Narrative:     Transition of Care [facility]) Screening Note   Patient Details  Name: Daniel Stephenson Date of Birth: 1985-07-21   Transition of Care [facility]) CM/SW Contact:    Zenon Mayo, RN Phone Number: 02/17/2021, 9:14 AM    Transition of Care Department [facility]) has reviewed patient and no TOC needs have been identified at this time. We will continue to monitor patient advancement through interdisciplinary progression rounds. If new patient transition needs arise, please place a TOC consult.          Expected Discharge Plan and Services                                                 Social Determinants of Health (SDOH) Interventions    Readmission Risk Interventions No flowsheet data found.

## 2021-02-17 NOTE — Discharge Summary (Addendum)
Name: Denys Salinger MRN: [id number] DOB: [birth year] 35 y.o. PCP: Patient, No Pcp Per (Inactive)  Date of Admission: 02/16/2021  7:25 AM Date of Discharge: 02/17/2021 Attending Physician: Velna Ochs, MD  Discharge Diagnosis: 1. ESRD on HD MWF 2. PD catheter related peritonitis 3. Recent abdominal hernia repair 4. HTN  Discharge Medications: Allergies as of 02/17/2021   Not on File      Medication List     STOP taking these medications    HYDROcodone-acetaminophen 5-325 MG tablet Commonly known as: NORCO/VICODIN       TAKE these medications    cyclobenzaprine 5 MG tablet Commonly known as: FLEXERIL Take 5 mg by mouth 3 (three) times daily as needed for muscle spasms. What changed: Another medication with the same name was removed. Continue taking this medication, and follow the directions you see here.   dicyclomine 10 MG capsule Commonly known as: BENTYL Take 10 mg by mouth 3 (three) times daily.   doxazosin 2 MG tablet Commonly known as: CARDURA Take 2 mg by mouth 2 (two) times daily.   famotidine 40 MG tablet Commonly known as: PEPCID Take 40 mg by mouth 2 (two) times daily.   hydrALAZINE 50 MG tablet Commonly known as: APRESOLINE Take 50 mg by mouth 3 (three) times daily.   labetalol 200 MG tablet Commonly known as: NORMODYNE Take 400 mg by mouth 2 (two) times daily.   NIFEdipine 60 MG 24 hr tablet Commonly known as: PROCARDIA XL/NIFEDICAL XL Take 60 mg by mouth 2 (two) times daily.   ondansetron 8 MG disintegrating tablet Commonly known as: ZOFRAN-ODT Take 8 mg by mouth.   Oxycodone HCl 10 MG Tabs Take 10 mg by mouth as needed (hernia repair pain).   oxyCODONE-acetaminophen 5-325 MG tablet Commonly known as: PERCOCET/ROXICET Take 1 tablet by mouth every 4 (four) hours as needed for severe pain.   sevelamer carbonate 800 MG tablet Commonly known as: RENVELA Take 800 mg by mouth 3 (three) times daily.   torsemide 20 MG  tablet Commonly known as: DEMADEX Take 40 mg by mouth 2 (two) times daily.        Disposition and follow-up:   Mr.Hjalmer Gabbard was discharged from [facility] in Stable condition.  At the hospital follow up visit please address:  1.  ESRD on HD MWF: Received HD on 02/16/2021 inpatient. Plan for him to travel home with spouse and resume normal outpatient HD in [place], [place] from tomorrow.  2. PD catheter related peritonitis: Received treatment for this at [facility] prior to coming to [facility] for Christmas with plans to receive vancomycin x1 week during HD sessions. Unfortunately, missed HD last week during travel to [facility]. Received one dose of vancomycin with HD yesterday. Nephrology to contact HD in [place], [place] for continuation of antibiotics.   2.  Labs / imaging needed at time of follow-up: CBC, Renal function panel  3.  Pending labs/ test needing follow-up: Hepatitis B surface Ab quant  Follow-up Appointments:   Hospital Course by problem list: 1. ESRD on HD MWF: Patient with history of ESRD 2/2 hypertensive kidney disease. Regularly attends HD in [place], [place]. Traveled to [place] for Christmas but was unable to schedule HD here and thus missed HD last week. Presented with [facility] and volume overload. Received HD yesterday per nephrology. Discussed with nephrology, plan for patient to travel home with spouse today and resume normal outpatient HD in [place], [place] from tomorrow.   2.  PD catheter related peritonitis, Recent abdominal hernia repair: Patient with recent hospitalization at [facility] for PD catheter related peritonitis. Treated with vancomycin and another antibiotic (patient unsure which one) with plan for continuing vancomycin x1 week with HD sessions. Records were requested from [facility] but never received. He received one dose of vancomycin during inpatient HD yesterday. Pain control with dilaudid. On exam, he  does have some abdominal pain but this appears to be localized to site of recent abdominal hernia repair (right around incision site). No peritoneal signs on exam. He appears a lot better today. Discussed with nephrology, plan for patient to travel back home with spouse and resume his usual HD schedule. Nephrology to contact dialysis center in [facility], [place] to discuss hospital course and continuation of vancomycin.     3. HTN: on multiple antihypertensives including hydralazine, labetalol, nifedipine, doxazosin, and torsemide. Initially continued only hydralazine and held others as patient was to receive HD as well. Blood pressures remained elevated after HD and thus other antihypertensives were resumed today as well. Will continue all home antihypertensives at discharge.  Subjective: Feels better today, states its from HD and from the dilaudid. No troubles with breakfast this morning. Still having some stomach pain just around hernia repair site.  Discharge Exam:   BP (!) 152/97 (BP Location: Left Arm)    Pulse 94    Temp 99.5 F (37.5 C) (Oral)    Resp 18    Ht 5\' 4"  (1.626 m)    Wt 81 kg Comment: scale c   SpO2 95%    BMI 30.64 kg/m  Discharge exam:  General: Young male, sitting up in bed eating breakfast, NAD. CV: normal rate and regular rhythm, no m/r/g. Pulm: CTABL, no adventitious sounds noted. Abdomen: soft, nondistended, normoactive bowel sounds. TTP only around surgical scar above umbilicus. No guarding noted, no peritonitic signs on exam. MSK: No peripheral edema noted. Skin: warm and dry. Neuro: AAOx4, no focal deficits noted.  Pertinent Labs, Studies, and Procedures:  CMP Latest Ref Rng & Units 02/17/2021 02/16/2021 01/03/2019  Glucose 70 - 99 mg/dL 103(H) 111(H) 106(H)  BUN 6 - 20 mg/dL 39(H) 84(H) 55(H)  Creatinine 0.61 - 1.24 mg/dL 11.21(H) 19.32(H) 7.96(H)  Sodium 135 - 145 mmol/L 137 136 138  Potassium 3.5 - 5.1 mmol/L 4.7 5.6(H) 3.9  Chloride 98 - 111 mmol/L 98 100  101  CO2 22 - 32 mmol/L 26 19(L) 25  Calcium 8.9 - 10.3 mg/dL 8.7(L) 8.5(L) 9.3  Total Protein 6.5 - 8.1 g/dL - 7.0 7.8  Total Bilirubin 0.3 - 1.2 mg/dL - 0.8 0.8  Alkaline Phos 38 - 126 U/L - 84 65  AST 15 - 41 U/L - 21 11(L)  ALT 0 - 44 U/L - 24 13   CBC Latest Ref Rng & Units 02/17/2021 02/16/2021 01/03/2019  WBC 4.0 - 10.5 K/uL 11.0(H) 10.8(H) 9.0  Hemoglobin 13.0 - 17.0 g/dL 7.6(L) 8.1(L) 12.5(L)  Hematocrit 39.0 - 52.0 % 22.8(L) 25.5(L) 38.7(L)  Platelets 150 - 400 K/uL 280 318 278   BNP    Component Value Date/Time   BNP 2,005.7 (H) 02/16/2021 0820   HS-Troponins:  58 > 53  Lipase     Component Value Date/Time   LIPASE 41 02/16/2021 0820      Discharge Instructions: Discharge Instructions     Diet - low sodium heart healthy   Complete by: As directed    Increase activity slowly   Complete by: As directed  Signed: Virl Axe, MD 02/17/2021, 11:35 AM   Pager: [phone]

## 2021-02-18 LAB — HEPATITIS B SURFACE ANTIBODY, QUANTITATIVE: Hep B S AB Quant (Post): >1000 m[IU]/mL (ref >9.9)

## 2022-08-10 IMAGING — DX DG CHEST 1V PORT
1 series · 1 of 1 positions shown · non-contrast
Comparison: 01/03/2019

CLINICAL DATA: Shortness of breath for 6 days without dialysis.

EXAM:
PORTABLE CHEST 1 VIEW

[chest]
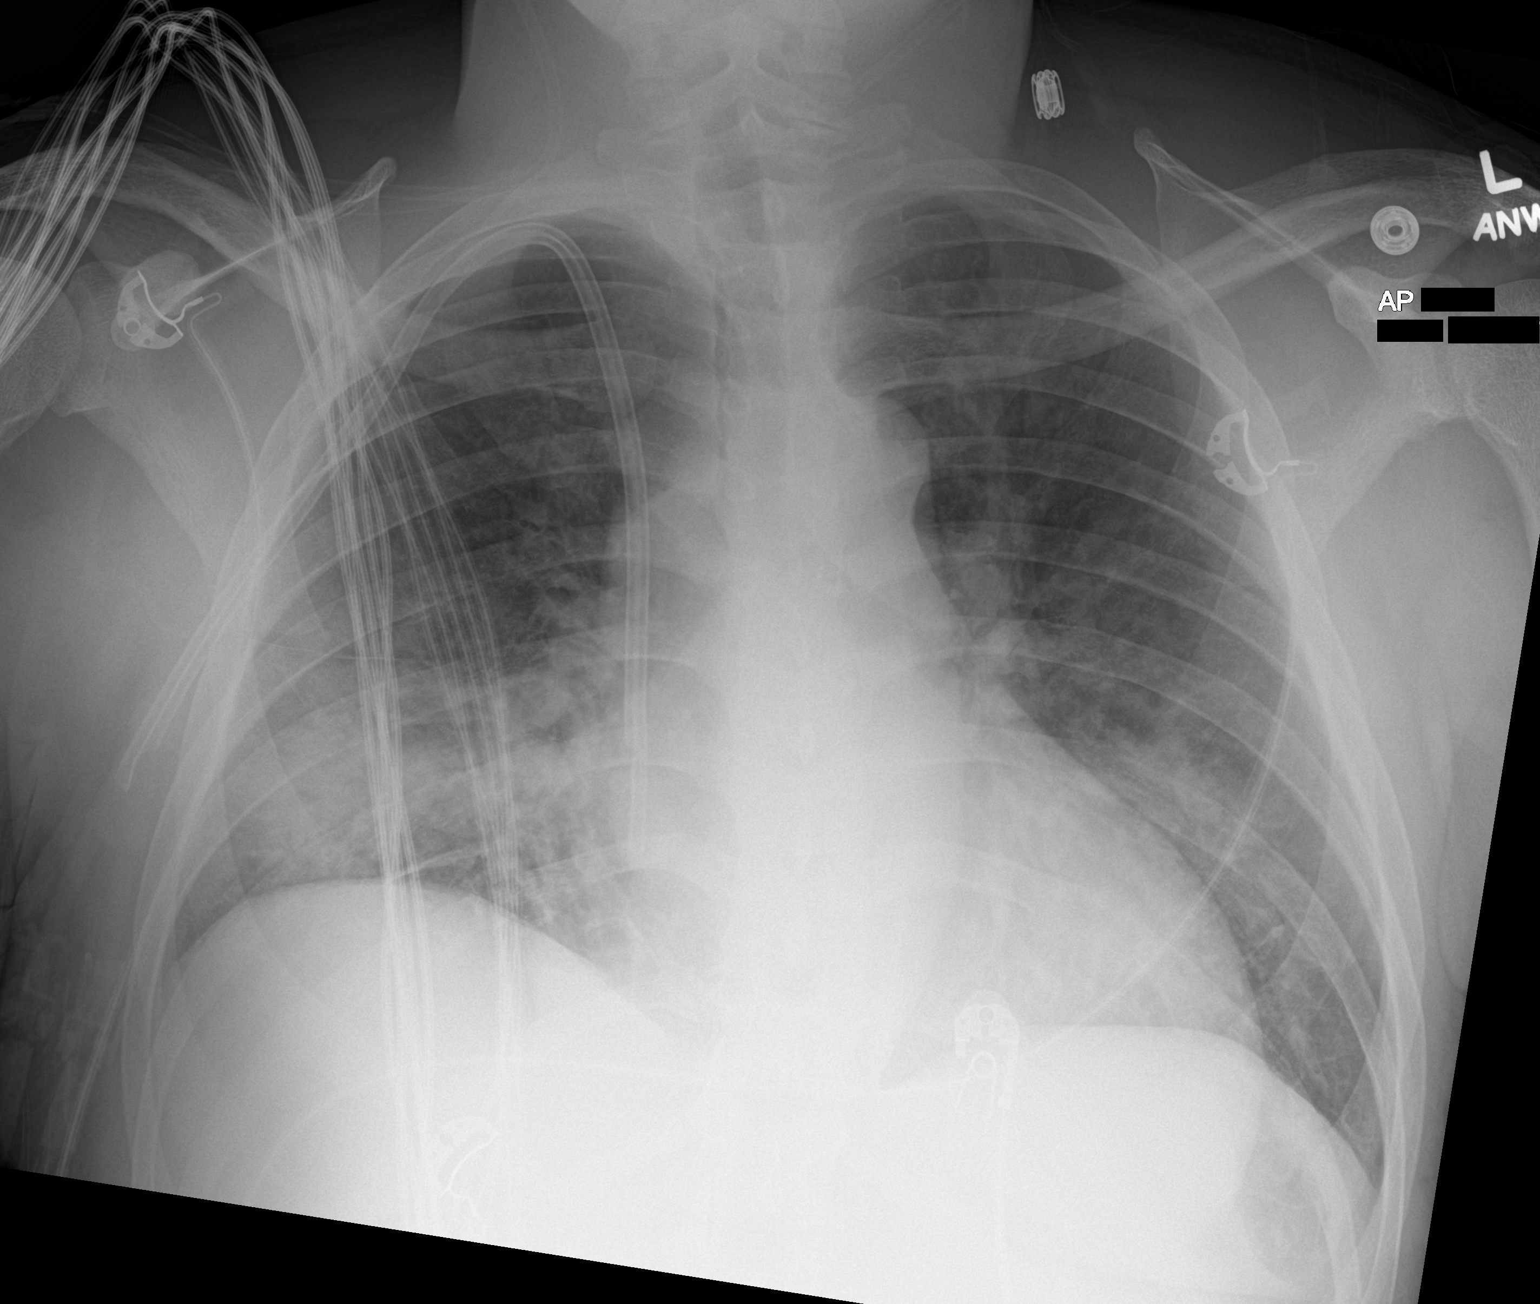

[1 of 1 positions shown; findings below may reference images not displayed]

FINDINGS: Dialysis catheter tip at mid right atrium. Numerous leads and wires
project over the chest. Midline trachea. Mild cardiomegaly. Mild
right hemidiaphragm elevation. No pleural effusion or pneumothorax.
Right greater than left interstitial and airspace disease is
slightly lower lobe predominant. The most confluent opacity is in
the right lower lobe.
IMPRESSION: Bilateral interstitial and airspace disease, which given the
clinical history, is most consistent with pulmonary edema. Given the
more confluent right lower lobe opacity, infection cannot be
entirely excluded but is felt less likely.

## 2022-08-10 IMAGING — CT CT ABD-PELV W/O CM
2 of 4 series · 16 of 46 positions shown, 18 images · non-contrast
Comparison: 01/03/2019

CLINICAL DATA: Abdominal pain

EXAM:
CT ABDOMEN AND PELVIS WITHOUT CONTRAST
TECHNIQUE: Multidetector CT imaging of the abdomen and pelvis was performed
following the standard protocol without IV contrast.

[Series 3: abd/ pelvis 5.0 i30f 2 · axial · 0.82mm/px · z∈[+775,+1190]mm · 13 of 91 slices shown, 15 images]
[im 4/91  soft-tissue]
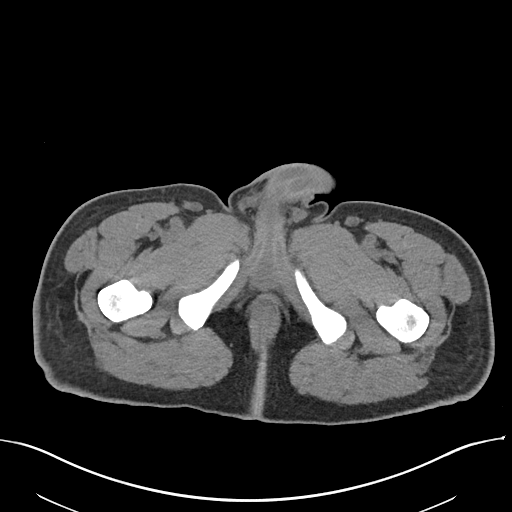
[im 4/91  bone]
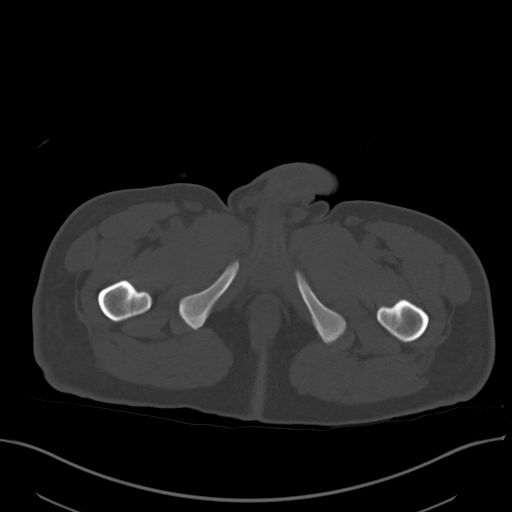
[im 11/91  soft-tissue]
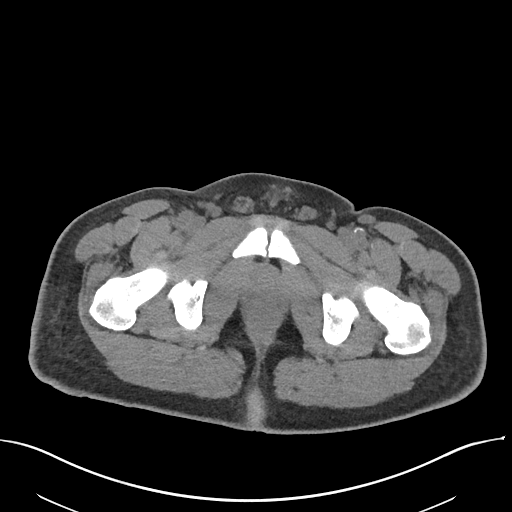
[im 19/91  soft-tissue]
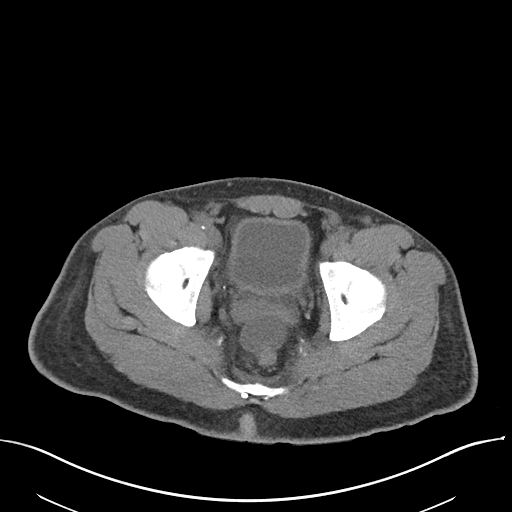
[im 26/91  soft-tissue]
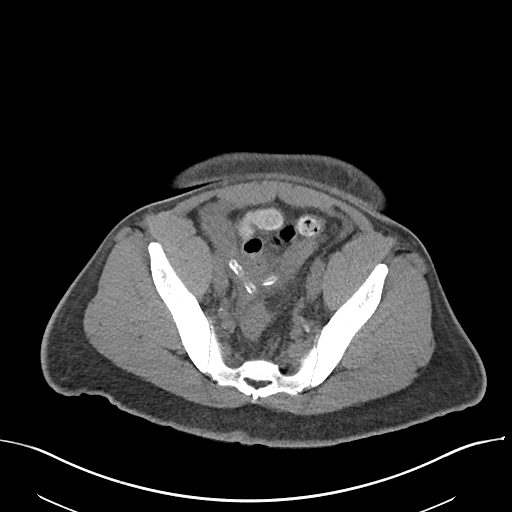
[im 33/91  soft-tissue]
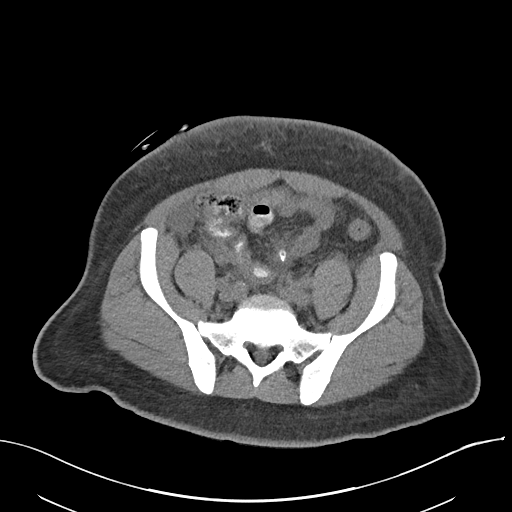
[im 40/91  soft-tissue]
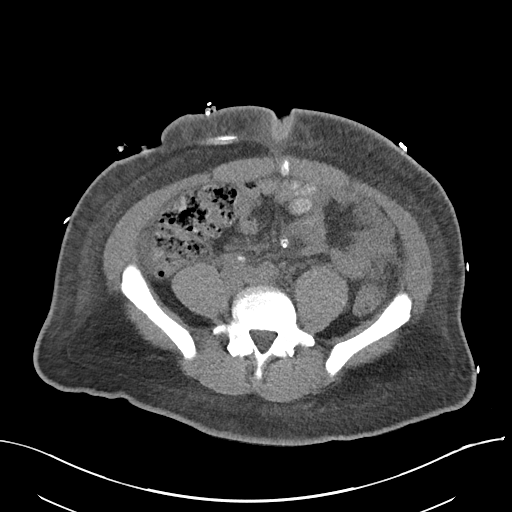
[im 47/91  soft-tissue]
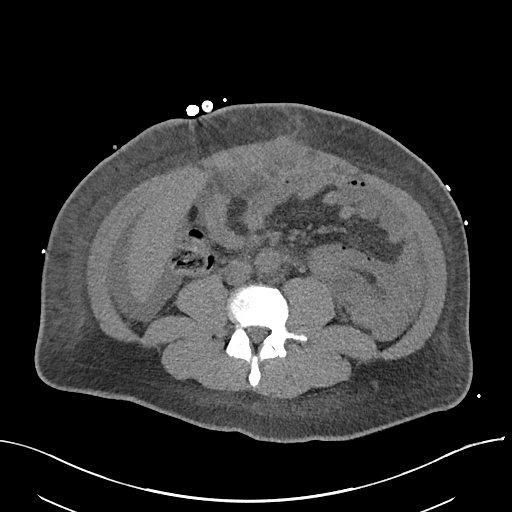
[im 51/91  soft-tissue]
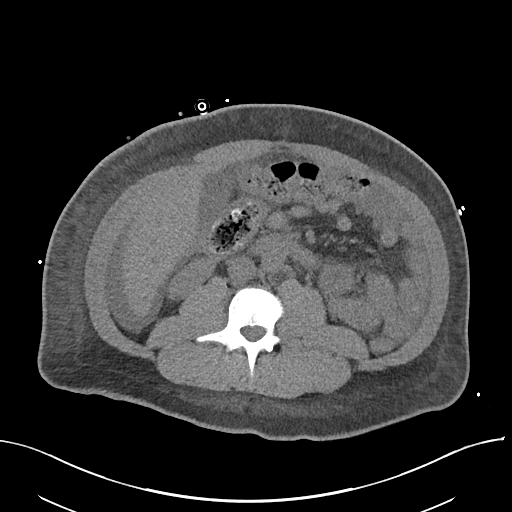
[im 58/91  soft-tissue]
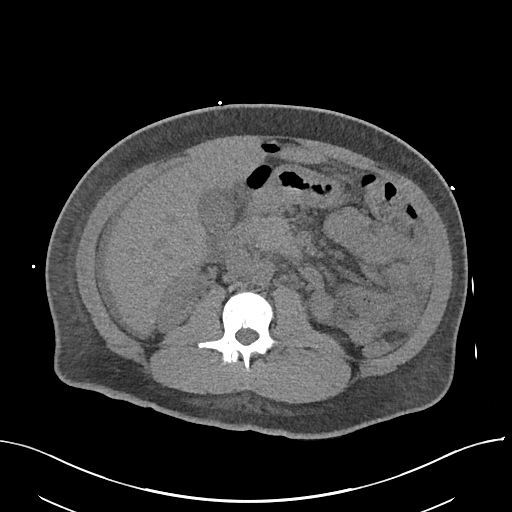
[im 58/91  bone]
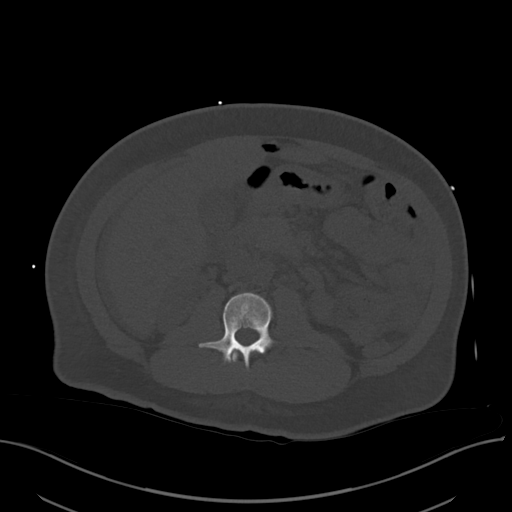
[im 65/91  soft-tissue]
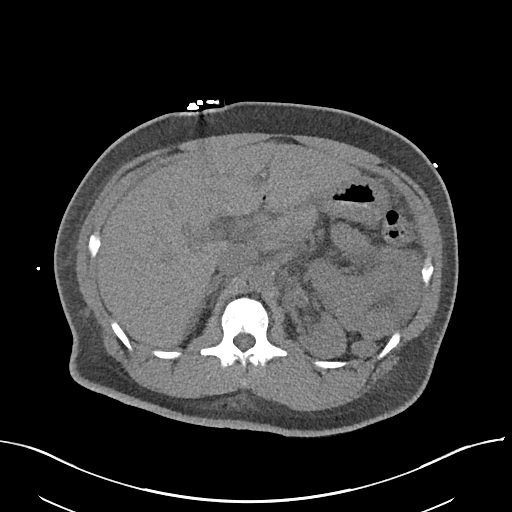
[im 73/91  soft-tissue]
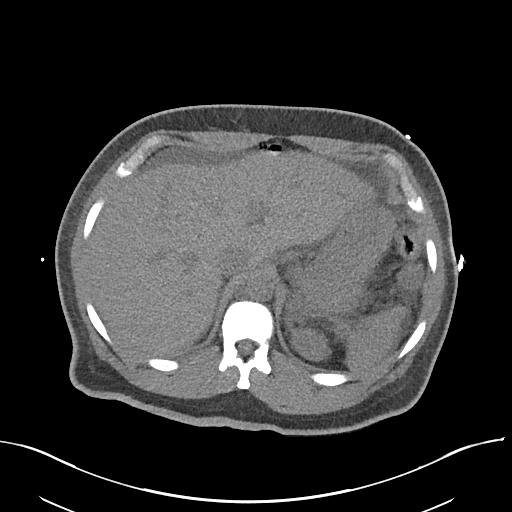
[im 80/91  soft-tissue]
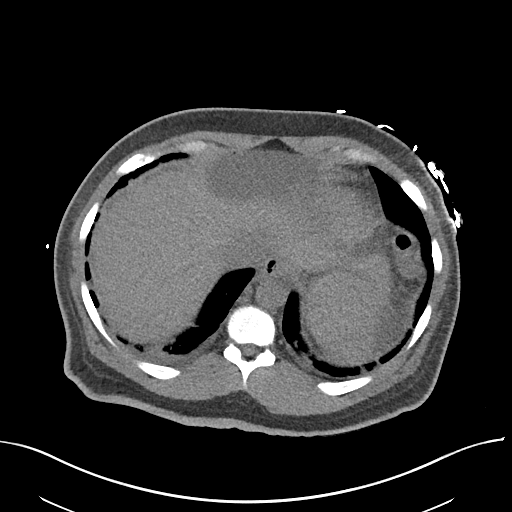
[im 87/91  soft-tissue]
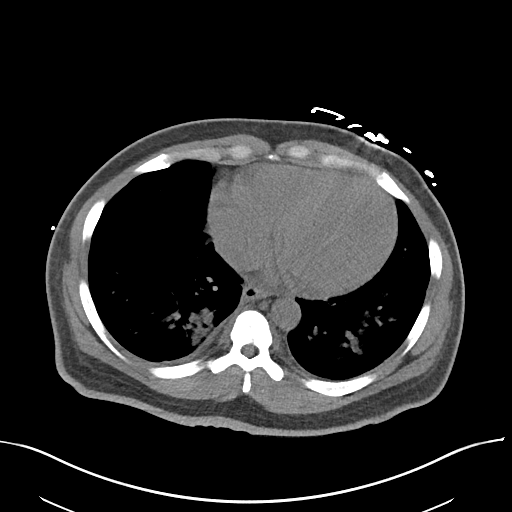

[Series 6: cor st · coronal · 0.78mm/px · 3 of 93 slices shown]
[im 31/93  soft-tissue]
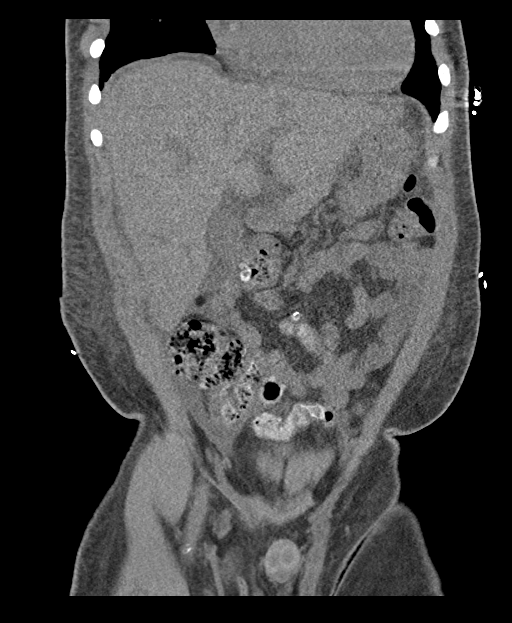
[im 41/93  soft-tissue]
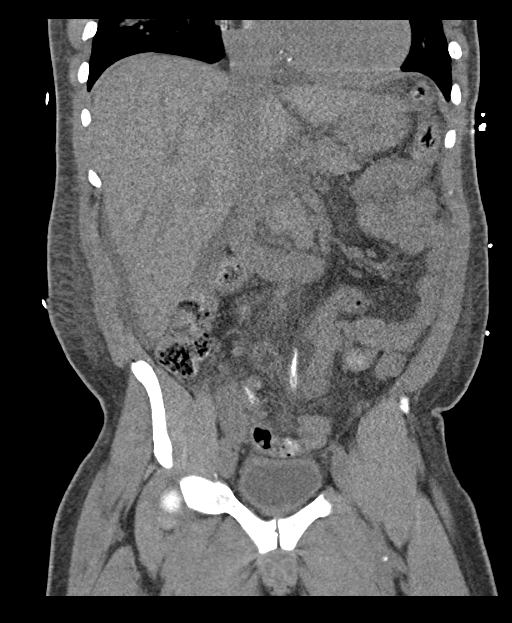
[im 52/93  soft-tissue]
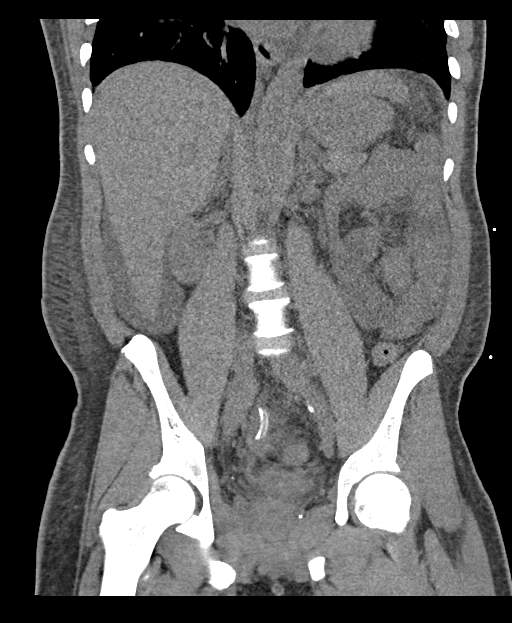

[16 of 46 positions shown; findings below may reference images not displayed]

FINDINGS: Lower chest: Fairly extensive new patchy alveolar densities seen in
both lower lung fields. Heart is enlarged in size. Minimal bilateral
pleural effusions are seen.

Hepatobiliary: Liver measures 22.2 cm in length. There is no
dilation of bile ducts. Gallbladder is unremarkable.

Pancreas: No focal abnormality is seen.

Spleen: Unremarkable.

Adrenals/Urinary Tract: There is possible hyperplasia of left
adrenal. Native kidneys are small in size. There is no
hydronephrosis. There are no renal or ureteral stones. Urinary
bladder is unremarkable.

Stomach/Bowel: Stomach is unremarkable. Small bowel loops are not
dilated. Appendix is not seen. There is no focal pericecal
inflammation. There is no significant wall thickening in the colon.

Vascular/Lymphatic: Unremarkable.

Reproductive: Unremarkable.

Other: Small ascites is present. Small amount of pneumoperitoneum is
noted. Peritoneal dialysis catheter is seen in the pelvis. There is
stranding in the subcutaneous plane suggesting anasarca. There is
edema in the subcutaneous plane in the periumbilical region without
any loculated fluid collections.

Musculoskeletal: Unremarkable.
IMPRESSION: There are new patchy infiltrates in both lower lung fields
suggesting bilateral pneumonia. Minimal bilateral pleural effusions
are seen more so on the right side. Cardiomegaly.

There is no evidence of intestinal obstruction. There is no
hydronephrosis. Native kidneys are small in size consistent with
medical renal disease.

Small ascites. Small amount of pneumoperitoneum is seen. This may be
related to peritoneal dialysis or suggest bowel perforation. Please
correlate with clinical symptoms and physical examination findings.

Enlarged liver.
